# Patient Record
Sex: Male | Born: 2000
Health system: Southern US, Community
[De-identification: ages and names within clinical notes are randomized; demographics above are authoritative.]

## PROBLEM LIST (undated history)

## (undated) ENCOUNTER — Emergency Department (HOSPITAL_COMMUNITY): Admission: EM | Payer: No Typology Code available for payment source | Source: Home / Self Care

---
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
## 2020-09-07 ENCOUNTER — Emergency Department (HOSPITAL_COMMUNITY): Payer: Medicaid Other

## 2020-09-07 ENCOUNTER — Emergency Department (HOSPITAL_COMMUNITY)
Admission: EM | Admit: 2020-09-07 | Discharge: 2020-09-07 | Disposition: A | Discharge status: Home / Self Care | Payer: Medicaid Other | Attending: Emergency Medicine | Admitting: Emergency Medicine

## 2020-09-07 ENCOUNTER — Encounter (HOSPITAL_COMMUNITY): Payer: Self-pay

## 2020-09-07 ENCOUNTER — Other Ambulatory Visit: Payer: Self-pay

## 2020-09-07 DIAGNOSIS — M545 Low back pain, unspecified: Secondary | ICD-10-CM | POA: Diagnosis not present

## 2020-09-07 DIAGNOSIS — Y92410 Unspecified street and highway as the place of occurrence of the external cause: Secondary | ICD-10-CM | POA: Insufficient documentation

## 2020-09-07 DIAGNOSIS — S79912A Unspecified injury of left hip, initial encounter: Secondary | ICD-10-CM | POA: Diagnosis present

## 2020-09-07 DIAGNOSIS — S7002XA Contusion of left hip, initial encounter: Secondary | ICD-10-CM | POA: Insufficient documentation

## 2020-09-07 DIAGNOSIS — M542 Cervicalgia: Secondary | ICD-10-CM | POA: Insufficient documentation

## 2020-09-07 DIAGNOSIS — F17210 Nicotine dependence, cigarettes, uncomplicated: Secondary | ICD-10-CM | POA: Insufficient documentation

## 2020-09-07 MED ORDER — IBUPROFEN 800 MG PO TABS: 800.0000 mg | ORAL_TABLET | Freq: Three times a day (TID) | ORAL | 0 refills | Status: DC

## 2020-09-07 MED ORDER — IBUPROFEN 800 MG PO TABS
800.0000 mg | ORAL_TABLET | Freq: Once | ORAL | Status: AC
  Administered 2020-09-07: 800 mg via ORAL
  Filled 2020-09-07: qty 1

## 2020-09-07 NOTE — Progress Notes (Signed)
Orthopedic Tech Progress Note Patient Details:  Christopher Arellano [birth year] [id number]  Ortho Devices Type of Ortho Device: Crutches Ortho Device/Splint Interventions: Ordered,Adjustment,Application   Post Interventions Patient Tolerated: Well Instructions Provided: Adjustment of device,Care of device,Poper ambulation with device   Metta Koranda P Harle Stanford 09/07/2020, 7:28 PM
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
## 2020-09-07 NOTE — ED Notes (Signed)
Placed the patient in a [place] J cervical collar.
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
## 2020-12-12 ENCOUNTER — Ambulatory Visit (HOSPITAL_COMMUNITY)
Admission: EM | Admit: 2020-12-12 | Discharge: 2020-12-12 | Disposition: A | Discharge status: Home / Self Care | Payer: Medicaid Other | Attending: Medical Oncology | Admitting: Medical Oncology

## 2020-12-12 ENCOUNTER — Other Ambulatory Visit: Payer: Self-pay

## 2020-12-12 ENCOUNTER — Encounter (HOSPITAL_COMMUNITY): Payer: Self-pay

## 2020-12-12 DIAGNOSIS — R369 Urethral discharge, unspecified: Secondary | ICD-10-CM | POA: Diagnosis present

## 2020-12-12 DIAGNOSIS — Z202 Contact with and (suspected) exposure to infections with a predominantly sexual mode of transmission: Secondary | ICD-10-CM | POA: Insufficient documentation

## 2020-12-12 MED ORDER — DOXYCYCLINE HYCLATE 100 MG PO CAPS: 100.0000 mg | ORAL_CAPSULE | Freq: Two times a day (BID) | ORAL | 0 refills | Status: DC

## 2020-12-12 NOTE — ED Triage Notes (Signed)
Pt presents with penile pain the past few days; pt states partner was recently tested for STDs.

## 2020-12-12 NOTE — ED Provider Notes (Addendum)
[facility]    CSN: [id number] Arrival date & time: 12/12/20  1032      History   Chief Complaint Chief Complaint  Patient presents with   SEXUALLY TRANSMITTED DISEASE    HPI Christopher Arellano is a 20 y.o. male.   HPI  STI screening: Patient reports that his partner told him that she had chlamydia.  Patient has noticed some penile discharge.  He denies any fevers, vomiting, testicular pain or abdominal pain.  He has not tried anything for symptoms.  History reviewed. No pertinent past medical history.  Patient Active Problem List   Diagnosis Date Noted   Current smoker 11/28/2018   Marijuana use 11/28/2018    History reviewed. No pertinent surgical history.     Home Medications    Prior to Admission medications   Medication Sig Start Date End Date Taking? Authorizing Provider  ibuprofen (ADVIL) 800 MG tablet Take 1 tablet (800 mg total) by mouth 3 (three) times daily. 09/07/20   Renne Crigler, PA-C    Family History Family History  Family history unknown: Yes    Social History Social History   Tobacco Use   Smoking status: Every Day    Packs/day: 0.10    Types: Cigarettes   Smokeless tobacco: Never  Substance Use Topics   Drug use: Yes    Frequency: 14.0 times per week    Types: Marijuana     Allergies   Patient has no known allergies.   Review of Systems Review of Systems  As stated above in HPI Physical Exam Triage Vital Signs ED Triage Vitals  Enc Vitals Group     BP 12/12/20 1234 125/73     Pulse Rate 12/12/20 1234 (!) 58     Resp 12/12/20 1234 17     Temp 12/12/20 1234 98.4 F (36.9 C)     Temp Source 12/12/20 1234 Oral     SpO2 12/12/20 1234 97 %     Weight --      Height --      Head Circumference --      Peak Flow --      Pain Score 12/12/20 1235 5     Pain Loc --      Pain Edu? --      Excl. in GC? --    No data found.  Updated Vital Signs BP 125/73 (BP Location: Left Arm)   Pulse (!) 58   Temp 98.4 F  (36.9 C) (Oral)   Resp 17   SpO2 97%   Physical Exam Vitals and nursing note reviewed.  Constitutional:      General: He is not in acute distress.    Appearance: Normal appearance. He is not ill-appearing, toxic-appearing or diaphoretic.  Genitourinary:    Comments: Pt performs self swab testing  Neurological:     Mental Status: He is alert.     UC Treatments / Results  Labs (all labs ordered are listed, but only abnormal results are displayed) Labs Reviewed - No data to display  EKG   Radiology No results found.  Procedures Procedures (including critical care time)  Medications Ordered in UC Medications - No data to display  Initial Impression / Assessment and Plan / UC Course  I have reviewed the triage vital signs and the nursing notes.  Pertinent labs & imaging results that were available during my care of the patient were reviewed by me and considered in my medical decision making (see chart for details).  New.  Treating for chlamydia with doxycycline.  Discussed how to use along with common potential side effects and precautions.  He will need to abstain from sexual intercourse until all partners are tested and cleared.  Condom use encouraged.  Further STI testing also requested by patient. Final Clinical Impressions(s) / UC Diagnoses   Final diagnoses:  None   Discharge Instructions   None    ED Prescriptions   None    PDMP not reviewed this encounter.   Rushie Chestnut, PA-C 12/12/20 1304    [address], PA-C 12/12/20 [phone]

## 2020-12-14 LAB — CYTOLOGY, (ORAL, ANAL, URETHRAL) ANCILLARY ONLY
Chlamydia: NEGATIVE
Comment: NEGATIVE
Comment: NEGATIVE
Comment: NORMAL
Neisseria Gonorrhea: NEGATIVE
Trichomonas: POSITIVE — AB

## 2020-12-15 ENCOUNTER — Telehealth (HOSPITAL_COMMUNITY): Payer: Self-pay | Admitting: Emergency Medicine

## 2020-12-15 MED ORDER — METRONIDAZOLE 500 MG PO TABS: 2000.0000 mg | ORAL_TABLET | Freq: Once | ORAL | 0 refills | Status: AC

## 2022-01-17 ENCOUNTER — Ambulatory Visit (HOSPITAL_COMMUNITY): Payer: Self-pay

## 2022-01-18 ENCOUNTER — Encounter (HOSPITAL_COMMUNITY): Payer: Self-pay

## 2022-01-18 ENCOUNTER — Ambulatory Visit (HOSPITAL_COMMUNITY)
Admission: EM | Admit: 2022-01-18 | Discharge: 2022-01-18 | Disposition: A | Discharge status: Home / Self Care | Payer: Medicaid Other | Attending: Family Medicine | Admitting: Family Medicine

## 2022-01-18 DIAGNOSIS — N342 Other urethritis: Secondary | ICD-10-CM | POA: Diagnosis present

## 2022-01-18 DIAGNOSIS — Z113 Encounter for screening for infections with a predominantly sexual mode of transmission: Secondary | ICD-10-CM | POA: Diagnosis present

## 2022-01-18 LAB — HIV ANTIBODY (ROUTINE TESTING W REFLEX): HIV Screen 4th Generation wRfx: NONREACTIVE

## 2022-01-18 MED ORDER — DOXYCYCLINE HYCLATE 100 MG PO CAPS: 100.0000 mg | ORAL_CAPSULE | Freq: Two times a day (BID) | ORAL | 0 refills | Status: AC

## 2022-01-18 MED ORDER — LIDOCAINE HCL (PF) 1 % IJ SOLN
INTRAMUSCULAR | Status: AC
  Filled 2022-01-18: qty 2

## 2022-01-18 MED ORDER — CEFTRIAXONE SODIUM 500 MG IJ SOLR
500.0000 mg | Freq: Once | INTRAMUSCULAR | Status: AC
  Administered 2022-01-18: 500 mg via INTRAMUSCULAR

## 2022-01-18 MED ORDER — CEFTRIAXONE SODIUM 500 MG IJ SOLR
INTRAMUSCULAR | Status: AC
  Filled 2022-01-18: qty 500

## 2022-01-18 NOTE — ED Triage Notes (Signed)
Patient having penile discharge(clear) and penile aching. Burning after urination. No visual skin changes. Onset 2 weeks ago.   No known STD exposure, no partners with symptoms.

## 2022-01-18 NOTE — ED Provider Notes (Signed)
[facility]    CSN: [id number] Arrival date & time: 01/18/22  1006      History   Chief Complaint Chief Complaint  Patient presents with   Penile Discharge   Groin Pain    HPI Christopher Arellano is a 21 y.o. male.    Penile Discharge  Groin Pain   Here for a 2-week history of dysuria and clear penile discharge.  No fever or vomiting.  No rash or itching.  No abdominal pain.     History reviewed. No pertinent past medical history.  Patient Active Problem List   Diagnosis Date Noted   Current smoker 11/28/2018   Marijuana use 11/28/2018    History reviewed. No pertinent surgical history.     Home Medications    Prior to Admission medications   Medication Sig Start Date End Date Taking? Authorizing Provider  doxycycline (VIBRAMYCIN) 100 MG capsule Take 1 capsule (100 mg total) by mouth 2 (two) times daily for 7 days. 01/18/22 01/25/22 Yes Celest Reitz, Janace Aris, MD    Family History Family History  Family history unknown: Yes    Social History Social History   Tobacco Use   Smoking status: Every Day    Packs/day: 0.10    Types: Cigarettes   Smokeless tobacco: Never  Substance Use Topics   Alcohol use: Not Currently   Drug use: Yes    Frequency: 14.0 times per week    Types: Marijuana     Allergies   Patient has no known allergies.   Review of Systems Review of Systems  Genitourinary:  Positive for penile discharge.     Physical Exam Triage Vital Signs ED Triage Vitals  Enc Vitals Group     BP 01/18/22 1149 121/71     Pulse Rate 01/18/22 1149 (!) 51     Resp 01/18/22 1149 16     Temp 01/18/22 1149 98.1 F (36.7 C)     Temp Source 01/18/22 1149 Oral     SpO2 01/18/22 1149 98 %     Weight --      Height --      Head Circumference --      Peak Flow --      Pain Score 01/18/22 1152 8     Pain Loc --      Pain Edu? --      Excl. in GC? --    No data found.  Updated Vital Signs BP 121/71 (BP Location: Left Arm)    Pulse (!) 51   Temp 98.1 F (36.7 C) (Oral)   Resp 16   SpO2 98%   Visual Acuity Right Eye Distance:   Left Eye Distance:   Bilateral Distance:    Right Eye Near:   Left Eye Near:    Bilateral Near:     Physical Exam Vitals reviewed.  Constitutional:      General: He is not in acute distress.    Appearance: He is not ill-appearing, toxic-appearing or diaphoretic.  Cardiovascular:     Rate and Rhythm: Normal rate and regular rhythm.  Pulmonary:     Effort: Pulmonary effort is normal.     Breath sounds: Normal breath sounds.  Abdominal:     Palpations: Abdomen is soft.     Tenderness: There is no abdominal tenderness.  Skin:    Coloration: Skin is not pale.  Neurological:     Mental Status: He is alert and oriented to person, place, and time.  Psychiatric:  Behavior: Behavior normal.      UC Treatments / Results  Labs (all labs ordered are listed, but only abnormal results are displayed) Labs Reviewed  HIV ANTIBODY (ROUTINE TESTING W REFLEX)  RPR  CYTOLOGY, (ORAL, ANAL, URETHRAL) ANCILLARY ONLY    EKG   Radiology No results found.  Procedures Procedures (including critical care time)  Medications Ordered in UC Medications  cefTRIAXone (ROCEPHIN) injection 500 mg (has no administration in time range)    Initial Impression / Assessment and Plan / UC Course  I have reviewed the triage vital signs and the nursing notes.  Pertinent labs & imaging results that were available during my care of the patient were reviewed by me and considered in my medical decision making (see chart for details).         He opted for empiric treatment.  Staff will notify him of any positives on the swab.  Also he opted for HIV and RPR screening.  On review of care everywhere and prior epic visits, he has had trichomonas and gonorrhea in the past.  I discussed with him barrier protection, and he states he has started to use condoms lately.  Printed education is given  on safe sex practices and on STDs Final Clinical Impressions(s) / UC Diagnoses   Final diagnoses:  Urethritis  Screening for STD (sexually transmitted disease)     Discharge Instructions      Staff will notify you of any positive tests on the swab or the blood work.   You have been given a shot of ceftriaxone 500 mg--this is for potential gonorrhea infection   Take doxycycline 100 mg --1 capsule 2 times daily for 7 days--this is for potential chlamydia infection      ED Prescriptions     Medication Sig Dispense Auth. Provider   doxycycline (VIBRAMYCIN) 100 MG capsule Take 1 capsule (100 mg total) by mouth 2 (two) times daily for 7 days. 14 capsule Marlinda Mike, Janace Aris, MD      PDMP not reviewed this encounter.   Zenia Resides, MD 01/18/22 1249

## 2022-01-18 NOTE — Discharge Instructions (Addendum)
Staff will notify you of any positive tests on the swab or the blood work.   You have been given a shot of ceftriaxone 500 mg--this is for potential gonorrhea infection   Take doxycycline 100 mg --1 capsule 2 times daily for 7 days--this is for potential chlamydia infection

## 2022-01-19 LAB — CYTOLOGY, (ORAL, ANAL, URETHRAL) ANCILLARY ONLY
Chlamydia: NEGATIVE
Comment: NEGATIVE
Comment: NEGATIVE
Comment: NORMAL
Neisseria Gonorrhea: NEGATIVE
Trichomonas: NEGATIVE

## 2022-01-19 LAB — RPR: RPR Ser Ql: NONREACTIVE

## 2022-03-16 ENCOUNTER — Ambulatory Visit: Payer: Self-pay | Admitting: Nurse Practitioner

## 2022-07-13 ENCOUNTER — Encounter (HOSPITAL_COMMUNITY): Payer: Self-pay

## 2022-07-13 ENCOUNTER — Ambulatory Visit (HOSPITAL_COMMUNITY)
Admission: EM | Admit: 2022-07-13 | Discharge: 2022-07-13 | Disposition: A | Discharge status: Home / Self Care | Payer: Medicaid Other | Attending: Family Medicine | Admitting: Family Medicine

## 2022-07-13 DIAGNOSIS — Z202 Contact with and (suspected) exposure to infections with a predominantly sexual mode of transmission: Secondary | ICD-10-CM | POA: Diagnosis present

## 2022-07-13 LAB — POCT URINALYSIS DIPSTICK, ED / UC
Bilirubin Urine: NEGATIVE
Glucose, UA: NEGATIVE mg/dL
Hgb urine dipstick: NEGATIVE
Leukocytes,Ua: NEGATIVE
Nitrite: NEGATIVE
Protein, ur: NEGATIVE mg/dL
Specific Gravity, Urine: 1.025 (ref 1.005–1.030)
Urobilinogen, UA: >=8 mg/dL (ref 0.0–1.0)
pH: 7 (ref 5.0–8.0)

## 2022-07-13 MED ORDER — CEFTRIAXONE SODIUM 500 MG IJ SOLR
INTRAMUSCULAR | Status: AC
  Filled 2022-07-13: qty 500

## 2022-07-13 MED ORDER — DOXYCYCLINE HYCLATE 100 MG PO CAPS: 100.0000 mg | ORAL_CAPSULE | Freq: Two times a day (BID) | ORAL | 0 refills | Status: AC

## 2022-07-13 MED ORDER — CEFTRIAXONE SODIUM 500 MG IJ SOLR
500.0000 mg | Freq: Once | INTRAMUSCULAR | Status: AC
  Administered 2022-07-13: 500 mg via INTRAMUSCULAR

## 2022-07-13 NOTE — ED Triage Notes (Addendum)
Patient still having penile pain and order after negative STD testing. Patient having pain in the groin and painful urination. No rash, swelling, discoloration of the skin, or discharge.

## 2022-07-13 NOTE — ED Provider Notes (Addendum)
[facility]    CSN: [id number] Arrival date & time: 07/13/22  1445      History   Chief Complaint Chief Complaint  Patient presents with   Groin Pain   Dysuria    HPI Christopher Arellano Christopher Arellano is a 22 y.o. male.   HPI Pleasant 22 year old male presents with possible exposure to STD.  Reports still having penile pain after having negative STD testing.  Reports pain in groin and painful urination.  Additionally, patient reports clear discharge.  PMH significant for current everyday cigarette smoker and marijuana user.  Patient believes that he has GC chlamydia and would like to be treated for this today.  History reviewed. No pertinent past medical history.  Patient Active Problem List   Diagnosis Date Noted   Current smoker 11/28/2018   Marijuana use 11/28/2018    History reviewed. No pertinent surgical history.     Home Medications    Prior to Admission medications   Medication Sig Start Date End Date Taking? Authorizing Provider  doxycycline (VIBRAMYCIN) 100 MG capsule Take 1 capsule (100 mg total) by mouth 2 (two) times daily for 10 days. 07/13/22 07/23/22 Yes Eliezer Lofts, FNP    Family History Family History  Family history unknown: Yes    Social History Social History   Tobacco Use   Smoking status: Every Day    Packs/day: 0.10    Types: Cigarettes   Smokeless tobacco: Never  Substance Use Topics   Alcohol use: Not Currently   Drug use: Yes    Frequency: 14.0 times per week    Types: Marijuana     Allergies   Patient has no known allergies.   Review of Systems Review of Systems  Genitourinary:  Positive for dysuria and penile pain.  All other systems reviewed and are negative.    Physical Exam Triage Vital Signs ED Triage Vitals  Enc Vitals Group     BP      Pulse      Resp      Temp      Temp src      SpO2      Weight      Height      Head Circumference      Peak Flow      Pain Score      Pain Loc      Pain Edu?       Excl. in [facility]?    No data found.  Updated Vital Signs BP 123/74 (BP Location: Right Arm)   Pulse 73   Temp 98 F (36.7 C) (Oral)   Resp 16   Ht '5\' 9"'$  (1.753 m)   Wt 150 lb (68 kg)   SpO2 98%   BMI 22.15 kg/m       Physical Exam Vitals and nursing note reviewed.  Constitutional:      General: He is not in acute distress.    Appearance: Normal appearance. He is normal weight. He is not ill-appearing.  HENT:     Head: Normocephalic and atraumatic.     Right Ear: Tympanic membrane, ear canal and external ear normal.     Left Ear: Tympanic membrane, ear canal and external ear normal.     Nose: Nose normal.     Mouth/Throat:     Mouth: Mucous membranes are moist.     Pharynx: Oropharynx is clear.  Eyes:     Extraocular Movements: Extraocular movements intact.     Conjunctiva/sclera: Conjunctivae normal.  Pupils: Pupils are equal, round, and reactive to light.  Cardiovascular:     Rate and Rhythm: Normal rate and regular rhythm.     Pulses: Normal pulses.     Heart sounds: Normal heart sounds. No murmur heard. Pulmonary:     Effort: Pulmonary effort is normal.     Breath sounds: Normal breath sounds. No wheezing, rhonchi or rales.  Abdominal:     Tenderness: There is no right CVA tenderness or left CVA tenderness.  Musculoskeletal:        General: Normal range of motion.     Cervical back: Normal range of motion and neck supple.  Skin:    General: Skin is warm and dry.  Neurological:     General: No focal deficit present.     Mental Status: He is alert and oriented to person, place, and time. Mental status is at baseline.      UC Treatments / Results  Labs (all labs ordered are listed, but only abnormal results are displayed) Labs Reviewed  POCT URINALYSIS DIPSTICK, ED / UC - Abnormal; Notable for the following components:      Result Value   Ketones, ur TRACE (*)    All other components within normal limits  CYTOLOGY, (ORAL, ANAL, URETHRAL) ANCILLARY ONLY     EKG   Radiology No results found.  Procedures Procedures (including critical care time)  Medications Ordered in UC Medications  cefTRIAXone (ROCEPHIN) injection 500 mg (500 mg Intramuscular Given 07/13/22 1635)    Initial Impression / Assessment and Plan / UC Course  I have reviewed the triage vital signs and the nursing notes.  Pertinent labs & imaging results that were available during my care of the patient were reviewed by me and considered in my medical decision making (see chart for details).     MDM: 1.  Potential exposure to STD-IM Rocephin given once in clinic prior to discharge, Rx'd Doxycycline patient believes that he has GC chlamydia with his current symptoms and requested treatment at this office visit. Instructed patient to take medication as directed with food to completion. Encourage patient to increase daily water intake to 64 ounces per day while taking this medication.  Advised we will follow-up with [facility] swab results once received.  Patient discharged home, hemodynamically stable. Final Clinical Impressions(s) / UC Diagnoses   Final diagnoses:  Potential exposure to STD     Discharge Instructions      Instructed patient to take medication as directed with food to completion. Encourage patient to increase daily water intake to 64 ounces per day while taking this medication.  Advised we will follow-up with [facility] swab results once received.     ED Prescriptions     Medication Sig Dispense Auth. Provider   doxycycline (VIBRAMYCIN) 100 MG capsule Take 1 capsule (100 mg total) by mouth 2 (two) times daily for 10 days. 20 capsule Eliezer Lofts, FNP      PDMP not reviewed this encounter.   Eliezer Lofts, [place] 07/13/22 1640    Eliezer Lofts, FNP 07/19/22 1027

## 2022-07-13 NOTE — Discharge Instructions (Addendum)
Instructed patient to take medication as directed with food to completion. Encourage patient to increase daily water intake to 64 ounces per day while taking this medication.  Advised we will follow-up with [facility] swab results once received.

## 2022-07-14 LAB — CYTOLOGY, (ORAL, ANAL, URETHRAL) ANCILLARY ONLY
Chlamydia: NEGATIVE
Comment: NEGATIVE
Comment: NEGATIVE
Comment: NORMAL
Neisseria Gonorrhea: NEGATIVE
Trichomonas: NEGATIVE

## 2022-08-19 ENCOUNTER — Ambulatory Visit: Payer: Medicaid Other | Admitting: Nurse Practitioner
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
## 2022-10-03 IMAGING — CR DG HIP (WITH OR WITHOUT PELVIS) 2-3V*L*
3 series · 3 of 3 positions shown · non-contrast
Comparison: None.

CLINICAL DATA: Pain

EXAM:
DG HIP (WITH OR WITHOUT PELVIS) 2-3V LEFT

[pelvis ap]
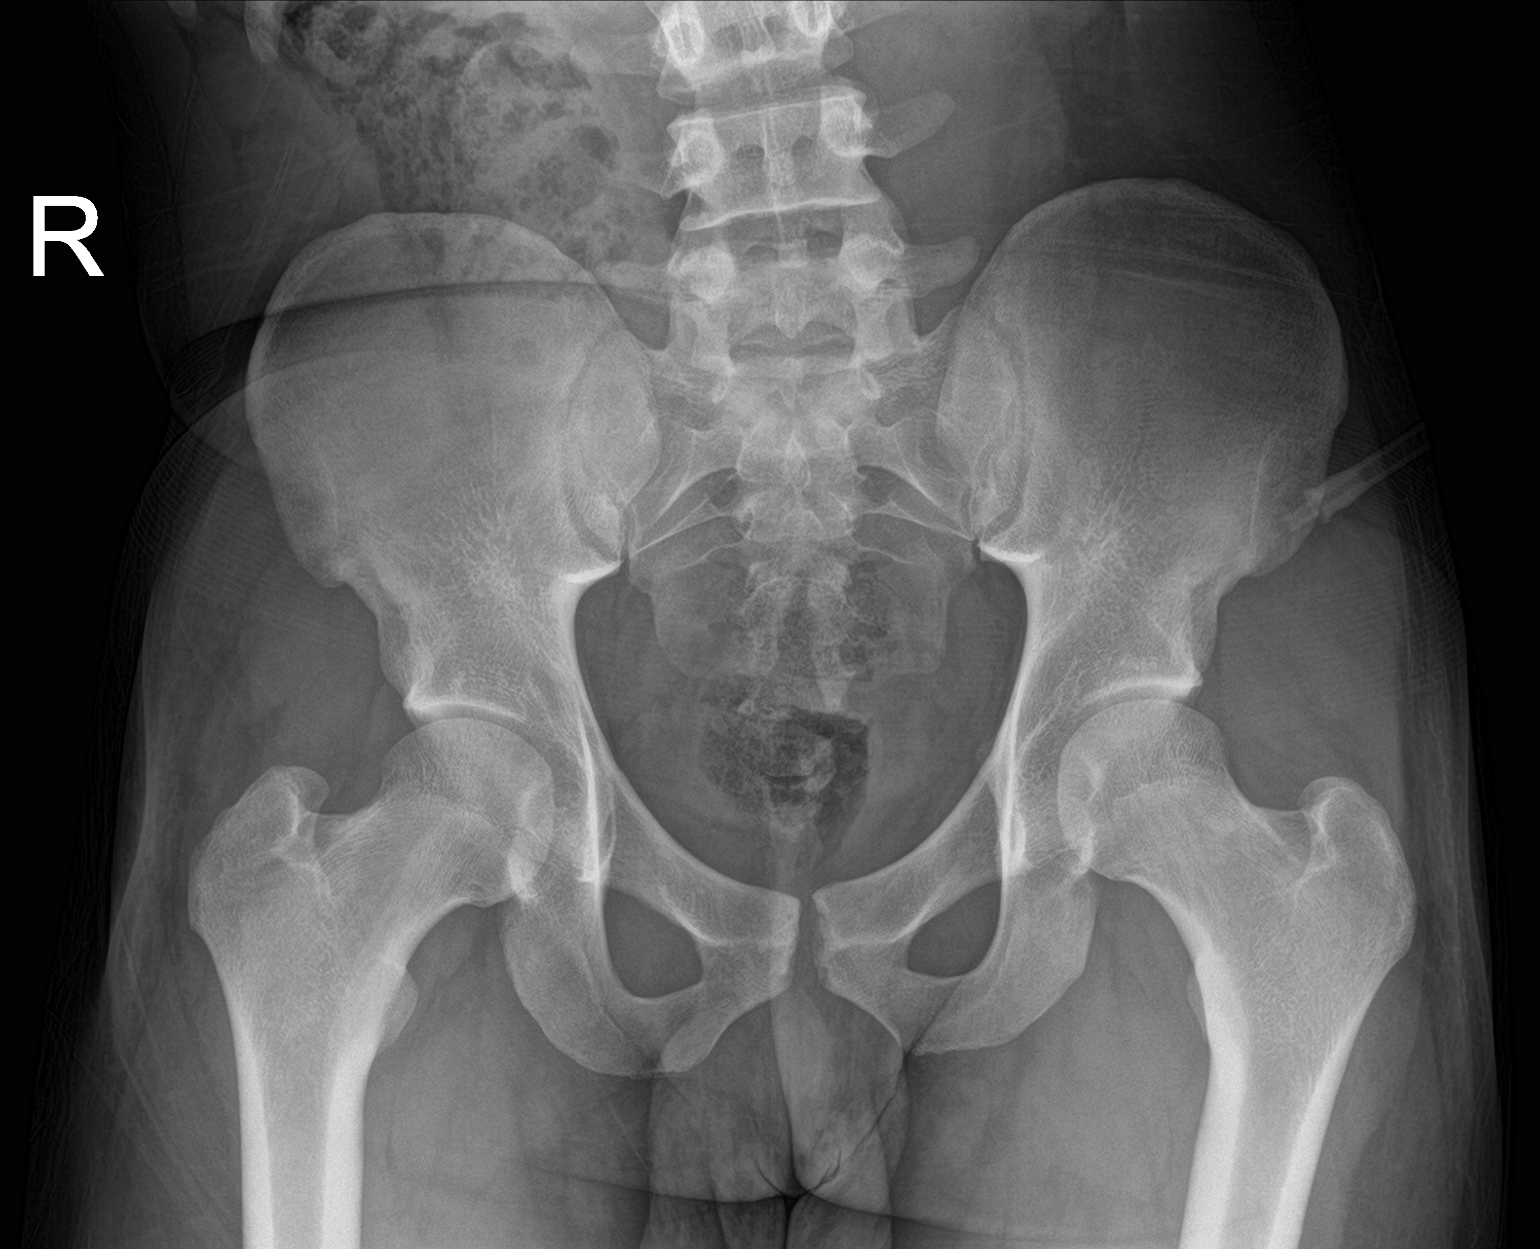

[hip lat]
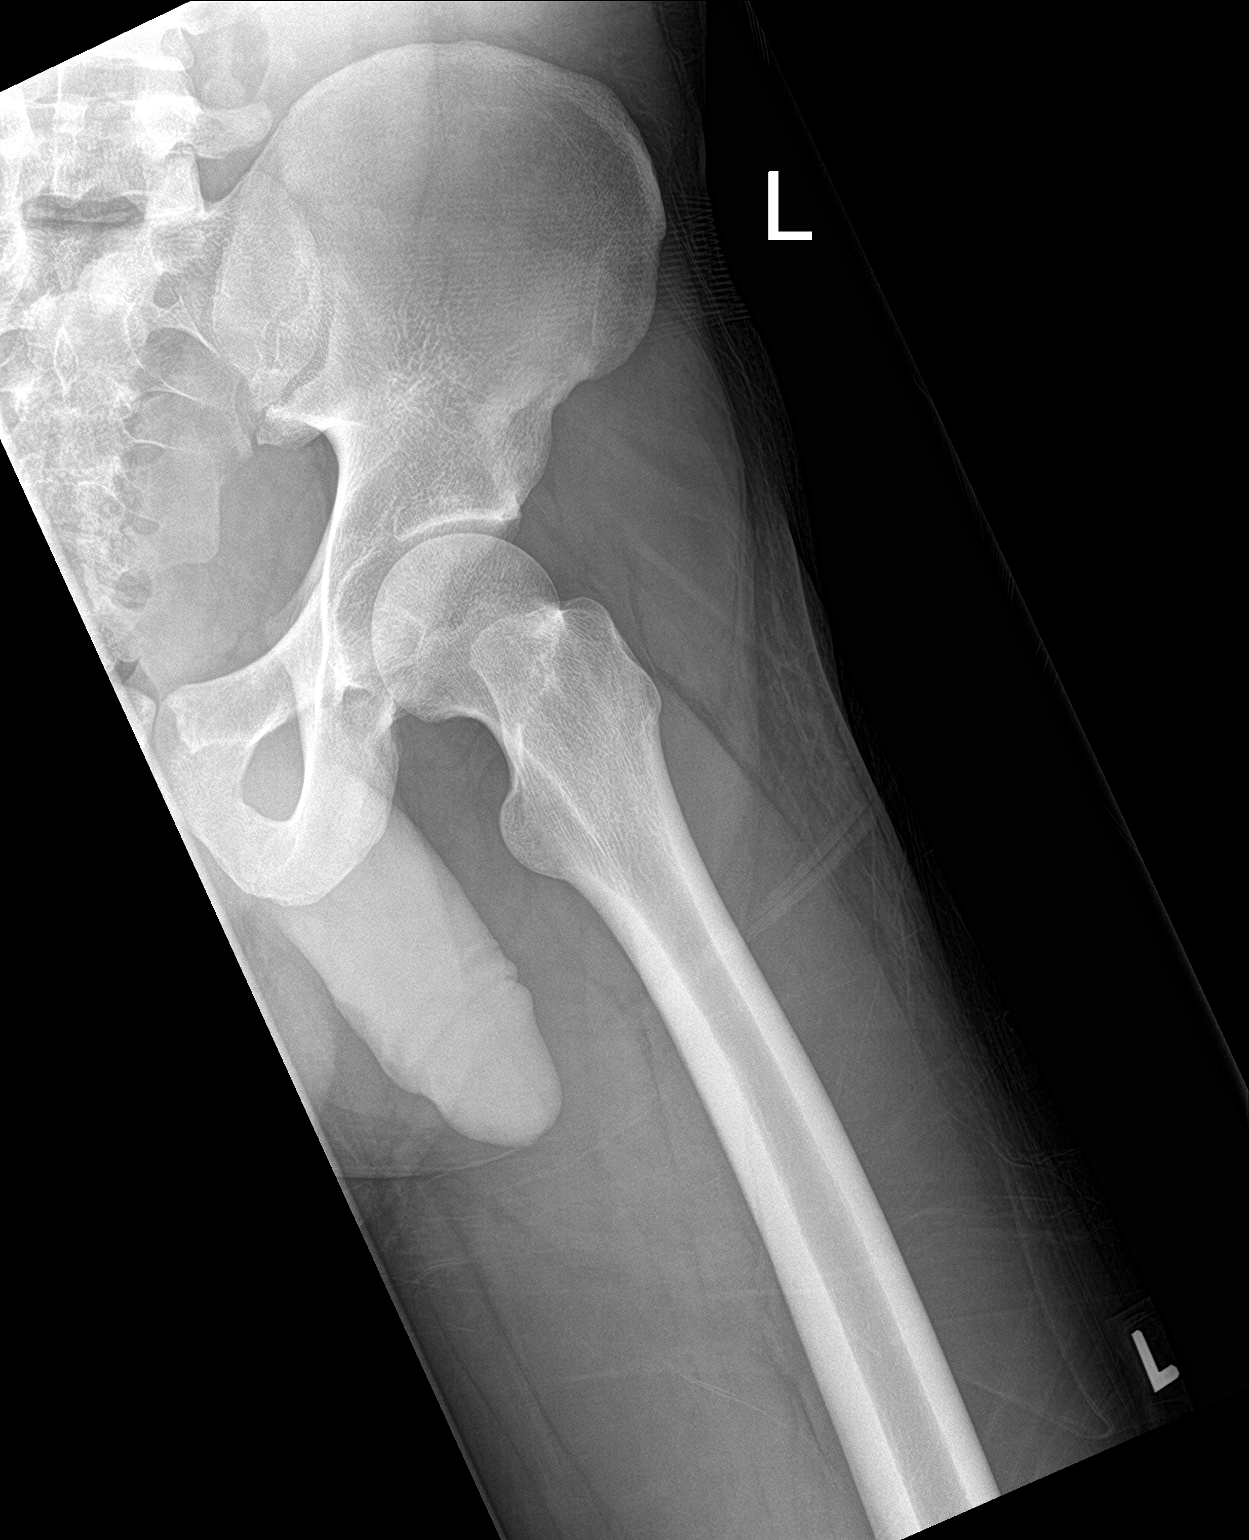

[hip ap]
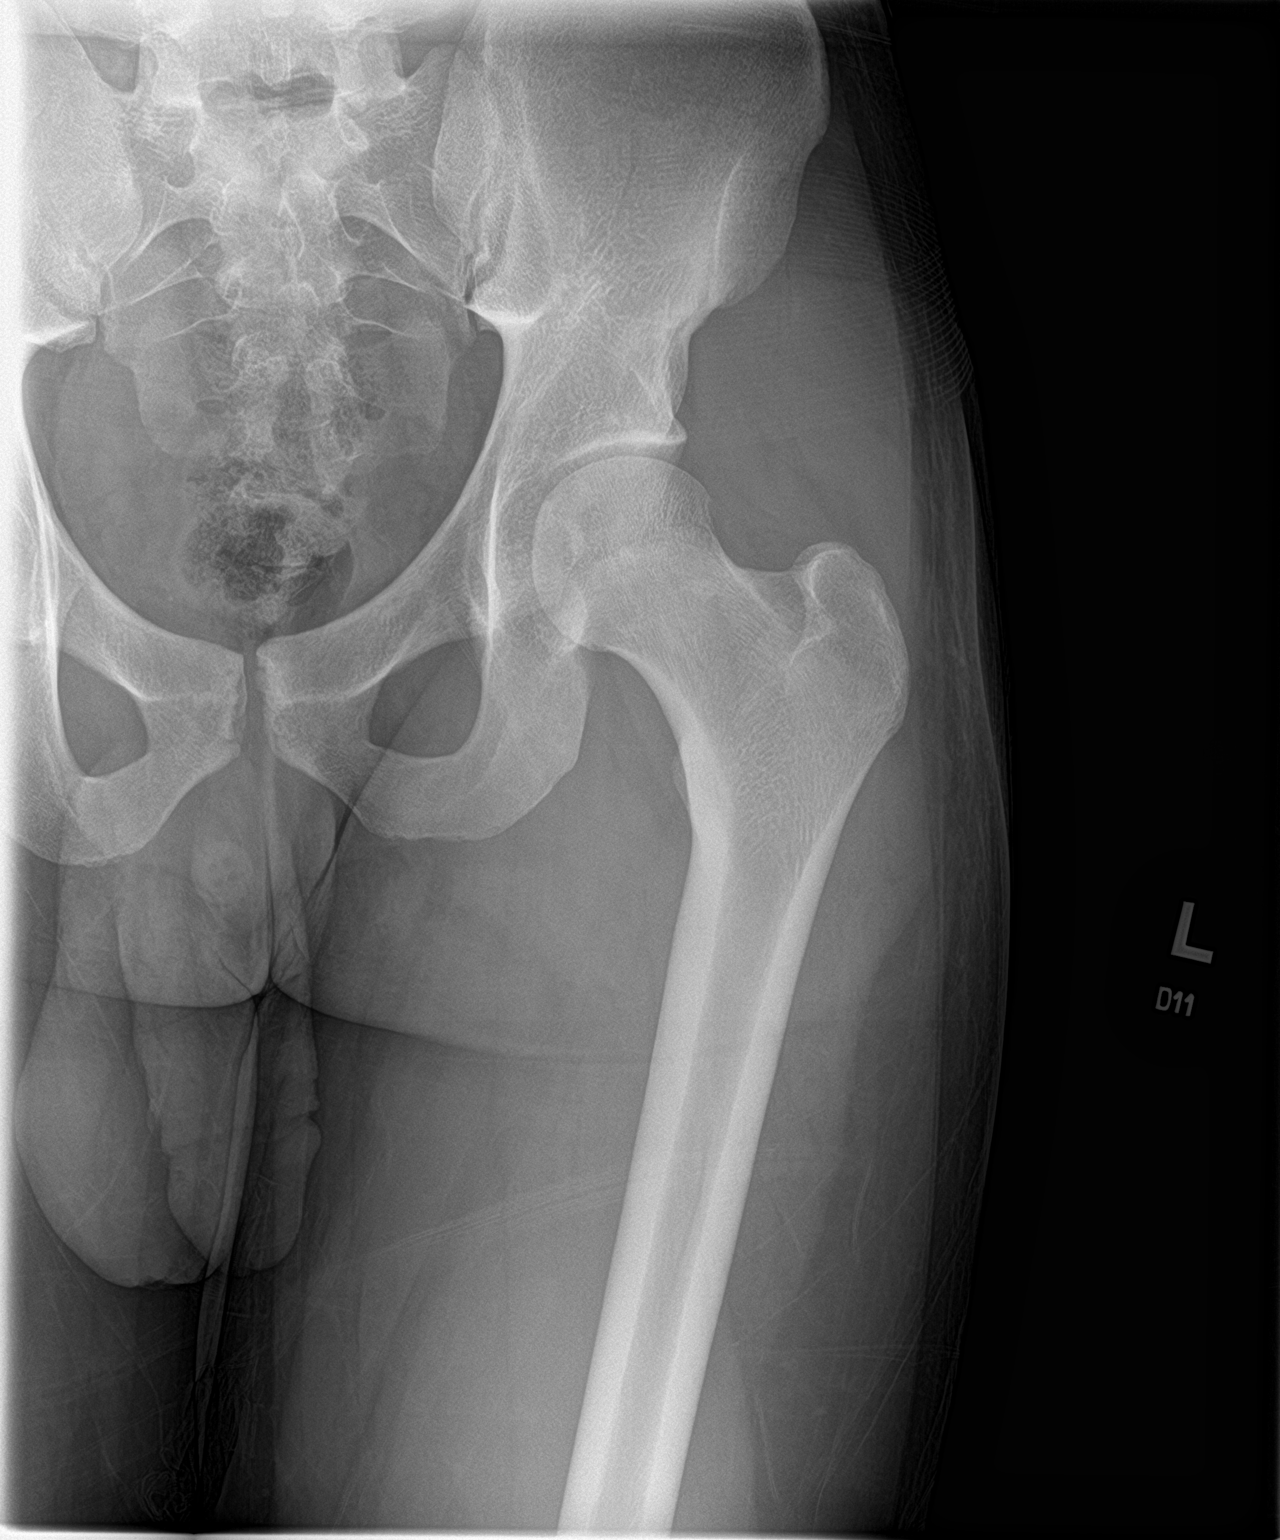

[3 of 3 positions shown; findings below may reference images not displayed]

FINDINGS: There is no evidence of hip fracture or dislocation. There is no
evidence of arthropathy or other focal bone abnormality.
IMPRESSION: Negative.

## 2023-05-09 ENCOUNTER — Ambulatory Visit (HOSPITAL_COMMUNITY)
Admission: EM | Admit: 2023-05-09 | Discharge: 2023-05-09 | Disposition: A | Discharge status: Home / Self Care | Payer: Medicaid Other | Attending: Family Medicine | Admitting: Family Medicine

## 2023-05-09 ENCOUNTER — Encounter (HOSPITAL_COMMUNITY): Payer: Self-pay

## 2023-05-09 DIAGNOSIS — N4889 Other specified disorders of penis: Secondary | ICD-10-CM | POA: Diagnosis not present

## 2023-05-09 DIAGNOSIS — N342 Other urethritis: Secondary | ICD-10-CM | POA: Diagnosis not present

## 2023-05-09 MED ORDER — DOXYCYCLINE HYCLATE 100 MG PO CAPS: 100.0000 mg | ORAL_CAPSULE | Freq: Two times a day (BID) | ORAL | 0 refills | Status: AC

## 2023-05-09 NOTE — Discharge Instructions (Signed)
Staff will notify you if there is anything positive on the swab.  Take doxycycline 100 mg --1 capsule 2 times daily for 7 days

## 2023-05-09 NOTE — ED Provider Notes (Addendum)
 [facility]    CSN: [id number] Arrival date & time: 05/09/23  1047      History   Chief Complaint No chief complaint on file.   HPI Christopher Arellano is a 22 y.o. male.   HPI Here for pain in the shaft of his penis and some clear discharge.  He began noting all this about 4 days ago.  The pain is not really with urination.  No fever or chills  No allergies to medications  He has not seen any swelling or rash or lesions on his penis. History reviewed. No pertinent past medical history.  Patient Active Problem List   Diagnosis Date Noted   Current smoker 11/28/2018   Marijuana use 11/28/2018    History reviewed. No pertinent surgical history.     Home Medications    Prior to Admission medications   Medication Sig Start Date End Date Taking? Authorizing Provider  doxycycline  (VIBRAMYCIN ) 100 MG capsule Take 1 capsule (100 mg total) by mouth 2 (two) times daily for 7 days. 05/09/23 05/16/23 Yes Merrik Puebla, Sharlet POUR, MD    Family History Family History  Family history unknown: Yes    Social History Social History   Tobacco Use   Smoking status: Every Day    Current packs/day: 0.10    Types: Cigarettes   Smokeless tobacco: Never  Substance Use Topics   Alcohol use: Not Currently   Drug use: Yes    Frequency: 14.0 times per week    Types: Marijuana     Allergies   Patient has no known allergies.   Review of Systems Review of Systems   Physical Exam Triage Vital Signs ED Triage Vitals [05/09/23 1224]  Encounter Vitals Group     BP 123/66     Systolic BP Percentile      Diastolic BP Percentile      Pulse Rate 78     Resp 18     Temp 97.8 F (36.6 C)     Temp Source Oral     SpO2 95 %     Weight      Height      Head Circumference      Peak Flow      Pain Score      Pain Loc      Pain Education      Exclude from Growth Chart    No data found.  Updated Vital Signs BP 123/66 (BP Location: Left Arm)   Pulse 78   Temp 97.8 F  (36.6 C) (Oral)   Resp 18   SpO2 95%   Visual Acuity Right Eye Distance:   Left Eye Distance:   Bilateral Distance:    Right Eye Near:   Left Eye Near:    Bilateral Near:     Physical Exam Vitals reviewed.  Constitutional:      General: He is not in acute distress.    Appearance: He is not ill-appearing, toxic-appearing or diaphoretic.  Genitourinary:    Comments: There are no vesicles or ulcerations or rash on the penis.  Also there is no swelling or erythema.  Chaperone is present during the exam Skin:    Coloration: Skin is not pale.  Neurological:     Mental Status: He is alert and oriented to person, place, and time.  Psychiatric:        Behavior: Behavior normal.      UC Treatments / Results  Labs (all labs ordered are listed, but only  abnormal results are displayed) Labs Reviewed  CYTOLOGY, (ORAL, ANAL, URETHRAL) ANCILLARY ONLY    EKG   Radiology No results found.  Procedures Procedures (including critical care time)  Medications Ordered in UC Medications - No data to display  Initial Impression / Assessment and Plan / UC Course  I have reviewed the triage vital signs and the nursing notes.  Pertinent labs & imaging results that were available during my care of the patient were reviewed by me and considered in my medical decision making (see chart for details).     Urethral self swab is done and staff will notify him of any positives and treat per protocol. Doxycycline  is sent in for empiric treatment. Final Clinical Impressions(s) / UC Diagnoses   Final diagnoses:  Urethritis  Penile pain     Discharge Instructions      Staff will notify you if there is anything positive on the swab.  Take doxycycline  100 mg --1 capsule 2 times daily for 7 days       ED Prescriptions     Medication Sig Dispense Auth. Provider   doxycycline  (VIBRAMYCIN ) 100 MG capsule Take 1 capsule (100 mg total) by mouth 2 (two) times daily for 7 days. 14  capsule Vonna, Saje Gallop K, MD      PDMP not reviewed this encounter.   Vonna Sharlet POUR, MD 05/09/23 1246    Vonna Sharlet POUR, MD 05/09/23 [phone]

## 2023-05-09 NOTE — ED Triage Notes (Signed)
Patient presents to the office STD testing. Patient states his penis is hurting.

## 2023-05-11 LAB — CYTOLOGY, (ORAL, ANAL, URETHRAL) ANCILLARY ONLY
Chlamydia: NEGATIVE
Comment: NEGATIVE
Comment: NEGATIVE
Comment: NORMAL
Neisseria Gonorrhea: NEGATIVE
Trichomonas: NEGATIVE

## 2023-05-15 ENCOUNTER — Ambulatory Visit
Admission: RE | Admit: 2023-05-15 | Discharge: 2023-05-15 | Disposition: A | Discharge status: Home / Self Care | Payer: Medicaid Other | Source: Ambulatory Visit | Attending: Family Medicine | Admitting: Family Medicine

## 2023-05-15 ENCOUNTER — Other Ambulatory Visit: Payer: Self-pay

## 2023-05-15 VITALS — BP 126/76 | HR 73 | Temp 98.4°F | Resp 16

## 2023-05-15 DIAGNOSIS — Z113 Encounter for screening for infections with a predominantly sexual mode of transmission: Secondary | ICD-10-CM | POA: Insufficient documentation

## 2023-05-15 DIAGNOSIS — N4889 Other specified disorders of penis: Secondary | ICD-10-CM | POA: Diagnosis present

## 2023-05-15 LAB — POCT URINALYSIS DIP (MANUAL ENTRY)
Blood, UA: NEGATIVE
Glucose, UA: NEGATIVE mg/dL
Leukocytes, UA: NEGATIVE
Nitrite, UA: NEGATIVE
Protein Ur, POC: 30 mg/dL — AB
Spec Grav, UA: >=1.03 — AB
Urobilinogen, UA: 1 U/dL
pH, UA: 6

## 2023-05-15 NOTE — Discharge Instructions (Addendum)
 I am retesting you for STD, you have been treated for Chlamydia with doxycycline ,  complete 2 doses.  Urine has been checked for infection is negative.  As discussed of all of your STD results are negative follow-up with the urology office listed above ongoing penile discomfort.

## 2023-05-15 NOTE — ED Triage Notes (Signed)
 States was seen in Urgent Care on 05/09/23 for STI testing and started on doxycycline . Test came back neg. He had sex the same day after testing and his partner then tested pos for chlamydia 2 days later. Still taking doxycycline . C/o pain under his penis and it hurts a little when he used the bathroom this morning. He denies having sex since 05/09/23

## 2023-05-15 NOTE — ED Provider Notes (Signed)
 [facility]    CSN: [id number] Arrival date & time: 05/15/23  0951      History   Chief Complaint Chief Complaint  Patient presents with   Exposure to STD    HPI Christopher Arellano is a 23 y.o. male.   Here today with complaint of pain under his penis.  Patient was treated with doxycycline  on 05/09/2023 after reporting an exposure to chlamydia.  His partner tested positive chlamydia and he is concerned that he did not test positive.  He endorses that he has been taking doxycycline  and has 2 doses remaining and continues to have pain ongoing in the painful area.  He is unable to characterize the pain or grab what precipitates the pain.  No new sexual contacts.  No discharge. History reviewed. No pertinent past medical history.  Patient Active Problem List   Diagnosis Date Noted   Current smoker 11/28/2018   Marijuana use 11/28/2018    History reviewed. No pertinent surgical history.     Home Medications    Prior to Admission medications   Medication Sig Start Date End Date Taking? Authorizing Provider  doxycycline  (VIBRAMYCIN ) 100 MG capsule Take 1 capsule (100 mg total) by mouth 2 (two) times daily for 7 days. 05/09/23 05/16/23 Yes Ban[place], Sharlet POUR, MD    Family History Family History  Family history unknown: Yes    Social History Social History   Tobacco Use   Smoking status: Every Day    Current packs/day: 0.10    Types: Cigarettes   Smokeless tobacco: Never  Vaping Use   Vaping status: Never Used  Substance Use Topics   Alcohol use: Not Currently   Drug use: Yes    Frequency: 14.0 times per week    Types: Marijuana     Allergies   Patient has no known allergies.   Review of Systems Review of Systems Pertinent negatives listed in HPI   Physical Exam Triage Vital Signs ED Triage Vitals  Encounter Vitals Group     BP 05/15/23 1006 126/76     Systolic BP Percentile --      Diastolic BP Percentile --      Pulse Rate 05/15/23 1006 73      Resp 05/15/23 1006 16     Temp 05/15/23 1006 98.4 F (36.9 C)     Temp Source 05/15/23 1006 Oral     SpO2 05/15/23 1006 98 %     Weight --      Height --      Head Circumference --      Peak Flow --      Pain Score 05/15/23 1012 5     Pain Loc --      Pain Education --      Exclude from Growth Chart --    No data found.  Updated Vital Signs BP 126/76 (BP Location: Right Arm)   Pulse 73   Temp 98.4 F (36.9 C) (Oral)   Resp 16   SpO2 98%   Visual Acuity Right Eye Distance:   Left Eye Distance:   Bilateral Distance:    Right Eye Near:   Left Eye Near:    Bilateral Near:     Physical Exam Vitals reviewed.  Constitutional:      Appearance: Normal appearance.  HENT:     Head: Normocephalic and atraumatic.  Eyes:     Extraocular Movements: Extraocular movements intact.     Pupils: Pupils are equal, round, and reactive to  light.  Cardiovascular:     Rate and Rhythm: Normal rate and regular rhythm.  Pulmonary:     Effort: Pulmonary effort is normal.     Breath sounds: Normal breath sounds.  Genitourinary:    Comments: Deferred genital exam  Musculoskeletal:     Cervical back: Normal range of motion and neck supple.  Skin:    General: Skin is warm and dry.  Neurological:     General: No focal deficit present.     Mental Status: He is alert and oriented to person, place, and time.    Urethral self swab collected   UC Treatments / Results  Labs (all labs ordered are listed, but only abnormal results are displayed) Labs Reviewed  POCT URINALYSIS DIP (MANUAL ENTRY) - Abnormal; Notable for the following components:      Result Value   Clarity, UA hazy (*)    Bilirubin, UA moderate (*)    Ketones, POC UA large (80) (*)    Spec Grav, UA >=1.030 (*)    Protein Ur, POC =30 (*)    All other components within normal limits  CYTOLOGY, (ORAL, ANAL, URETHRAL) ANCILLARY ONLY    EKG   Radiology No results found.  Procedures Procedures (including critical  care time)  Medications Ordered in UC Medications - No data to display  Initial Impression / Assessment and Plan / UC Course  I have reviewed the triage vital signs and the nursing notes.  Pertinent labs & imaging results that were available during my care of the patient were reviewed by me and considered in my medical decision making (see chart for details).   Educated patient regarding negative STD test and the window of time that sometimes it takes to show positive.  Explained that given he had had a recent exposure he would have been treated with the doxycycline  despite test results. Today reported he is to continue to have symptoms of discomfort in the penile area.  Obtained a UA which was unremarkable. Advised I would repeat STD testing however patient reports he has been compliant with doxycycline  low suspicion for a positive chlamydia test.  Vies to follow-up with urology if all STD testing is negative and he continues to have penile discomfort. Final Clinical Impressions(s) / UC Diagnoses   Final diagnoses:  Screen for STD (sexually transmitted disease)  Penile pain     Discharge Instructions      I am retesting you for STD, you have been treated for Chlamydia with doxycycline ,  complete 2 doses.  Urine has been checked for infection is negative.  As discussed of all of your STD results are negative follow-up with the urology office listed above ongoing penile discomfort.     ED Prescriptions   None    PDMP not reviewed this encounter.   Arloa Suzen RAMAN, NP 05/15/23 [phone]

## 2023-05-16 LAB — CYTOLOGY, (ORAL, ANAL, URETHRAL) ANCILLARY ONLY
Chlamydia: NEGATIVE
Comment: NEGATIVE
Comment: NEGATIVE
Comment: NORMAL
Neisseria Gonorrhea: NEGATIVE
Trichomonas: NEGATIVE

## 2023-05-26 ENCOUNTER — Ambulatory Visit: Payer: Medicaid Other | Admitting: Nurse Practitioner
